# Patient Record
Sex: Female | Born: 2005 | Race: Black or African American | Hispanic: No | Marital: Single | Smoking: Never smoker
Health system: Southern US, Community
[De-identification: ages and names within clinical notes are randomized; demographics above are authoritative.]

## PROBLEM LIST (undated history)

## (undated) DIAGNOSIS — L910 Hypertrophic scar: Secondary | ICD-10-CM

## (undated) DIAGNOSIS — E301 Precocious puberty: Secondary | ICD-10-CM

---
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
## 2024-01-04 ENCOUNTER — Telehealth: Payer: Self-pay | Admitting: Radiation Oncology

## 2024-01-04 NOTE — Telephone Encounter (Signed)
 8/26 @ 10:20 am Patient's mother called to setup appt same day after sx for keloid removal.  Called Support RTT forward call as requested.

## 2024-02-17 ENCOUNTER — Encounter: Payer: Self-pay | Admitting: Dermatology

## 2024-02-23 ENCOUNTER — Ambulatory Visit
Admission: RE | Admit: 2024-02-23 | Discharge: 2024-02-23 | Disposition: A | Source: Ambulatory Visit | Attending: Radiation Oncology

## 2024-02-23 ENCOUNTER — Encounter: Payer: Self-pay | Admitting: Dermatology

## 2024-02-23 ENCOUNTER — Ambulatory Visit
Admission: RE | Admit: 2024-02-23 | Discharge: 2024-02-23 | Disposition: A | Discharge status: Home / Self Care | Source: Ambulatory Visit | Attending: Radiation Oncology | Admitting: Radiation Oncology

## 2024-02-23 ENCOUNTER — Ambulatory Visit (INDEPENDENT_AMBULATORY_CARE_PROVIDER_SITE_OTHER): Admitting: Dermatology

## 2024-02-23 ENCOUNTER — Other Ambulatory Visit: Payer: Self-pay

## 2024-02-23 VITALS — BP 142/84 | HR 83

## 2024-02-23 DIAGNOSIS — L910 Hypertrophic scar: Secondary | ICD-10-CM | POA: Diagnosis not present

## 2024-02-23 DIAGNOSIS — Z510 Encounter for antineoplastic radiation therapy: Secondary | ICD-10-CM | POA: Insufficient documentation

## 2024-02-23 DIAGNOSIS — D485 Neoplasm of uncertain behavior of skin: Secondary | ICD-10-CM

## 2024-02-23 LAB — RAD ONC ARIA SESSION SUMMARY
Course Elapsed Days: 0
Plan Fractions Treated to Date: 1
Plan Prescribed Dose Per Fraction: 5 Gy
Plan Total Fractions Prescribed: 3
Plan Total Prescribed Dose: 15 Gy
Reference Point Dosage Given to Date: 5 Gy
Reference Point Session Dosage Given: 5 Gy
Session Number: 1

## 2024-02-23 MED ORDER — TRIAMCINOLONE ACETONIDE 10 MG/ML IJ SUSP
10.0000 mg | Freq: Once | INTRAMUSCULAR | Status: AC
  Administered 2024-02-23: 10 mg

## 2024-02-23 NOTE — Patient Instructions (Signed)

## 2024-02-23 NOTE — Progress Notes (Addendum)
 Follow-Up Visit   Subjective  Shelby Tran is a 18 y.o. female who presents for the following: Excision of 2 keloids on the left ear. Patient is scheduled for treatment with radiation therapy, later today with Dr. Izell.   She is accompanied by her parents.   The following portions of the chart were reviewed this encounter and updated as appropriate: medications, allergies, medical history  Review of Systems:  No other skin or systemic complaints except as noted in HPI or Assessment and Plan.  Objective  Well appearing patient in no apparent distress; mood and affect are within normal limits.  A focused examination was performed of the following areas: Left ear Relevant physical exam findings are noted in the Assessment and Plan.   superior Left Ear Firm skin colored plaque  Left Ear Firm skin colored plaque  Assessment & Plan   NEOPLASM OF UNCERTAIN BEHAVIOR OF SKIN (2) superior Left Ear Skin excision  Excision method:  elliptical Lesion length (cm):  2.8 Lesion width (cm):  0 Margin per side (cm):  0.1 Total excision diameter (cm):  3 Informed consent: discussed and consent obtained   Timeout: patient name, date of birth, surgical site, and procedure verified   Procedure prep:  Patient was prepped and draped in usual sterile fashion Prep type:  Chlorhexidine Anesthesia: the lesion was anesthetized in a standard fashion   Anesthetic:  1% lidocaine  w/ epinephrine  1-100,000 buffered w/ 8.4% NaHCO3 Instrument used: #15 blade   Hemostasis achieved with: pressure and electrodesiccation   Outcome: patient tolerated procedure well with no complications   Post-procedure details: sterile dressing applied and wound care instructions given   Dressing type: bandage, petrolatum and pressure dressing    Specimen 1 - Surgical pathology Differential Diagnosis: R/O keloid vs other  Check Margins: No Left Ear Skin excision  Excision method:  elliptical Lesion length (cm):   2.2 Lesion width (cm):  0 Margin per side (cm):  0.1 Total excision diameter (cm):  2.4 Informed consent: discussed and consent obtained   Timeout: patient name, date of birth, surgical site, and procedure verified   Procedure prep:  Patient was prepped and draped in usual sterile fashion Prep type:  Chlorhexidine Anesthesia: the lesion was anesthetized in a standard fashion   Anesthetic:  1% lidocaine  w/ epinephrine  1-100,000 buffered w/ 8.4% NaHCO3 Instrument used: #15 blade   Hemostasis achieved with: pressure and electrodesiccation   Outcome: patient tolerated procedure well with no complications   Post-procedure details: sterile dressing applied and wound care instructions given   Dressing type: pressure dressing, bandage and petrolatum    Specimen 2 - Surgical pathology Differential Diagnosis: r/o Keloid vs other  Check Margins: No Procedure Note Intralesional Injection  Location: left ear  Informed Consent: Discussed risks (infection, pain, bleeding, bruising, thinning of the skin, loss of skin pigment, lack of resolution, and recurrence of lesion) and benefits of the procedure, as well as the alternatives. Informed consent was obtained. Preparation: The area was prepared a standard fashion.  Anesthesia:Lidocaine  1% with epinephrine   Procedure Details: An intralesional injection was performed with Kenalog  10 mg/cc. 0.9 cc in total were injected. NDC #: 9996-9505-79 Exp: March 2026 [id number]:[id number]  Total number of injections: 2  Plan: The patient was instructed on post-op care. Recommend OTC analgesia as needed for pain.  Related Medications triamcinolone  acetonide (KENALOG ) 10 MG/ML injection 10 mg    Return in about 4 weeks (around 03/22/2024) for Keloid f/u and injections .  I, Berwyn Lesches, Surg Tech  III, am acting as scribe for RUFUS CHRISTELLA HOLY, MD.   Documentation: I have reviewed the above documentation for accuracy and completeness, and I agree with the  above.  RUFUS CHRISTELLA HOLY, MD

## 2024-02-24 ENCOUNTER — Ambulatory Visit

## 2024-02-25 ENCOUNTER — Other Ambulatory Visit: Payer: Self-pay

## 2024-02-25 ENCOUNTER — Ambulatory Visit

## 2024-02-25 ENCOUNTER — Ambulatory Visit
Admission: RE | Admit: 2024-02-25 | Discharge: 2024-02-25 | Disposition: A | Source: Ambulatory Visit | Attending: Radiation Oncology

## 2024-02-25 ENCOUNTER — Ambulatory Visit: Payer: Self-pay | Admitting: Dermatology

## 2024-02-25 DIAGNOSIS — Z510 Encounter for antineoplastic radiation therapy: Secondary | ICD-10-CM | POA: Diagnosis not present

## 2024-02-25 LAB — RAD ONC ARIA SESSION SUMMARY
Course Elapsed Days: 2
Plan Fractions Treated to Date: 2
Plan Prescribed Dose Per Fraction: 5 Gy
Plan Total Fractions Prescribed: 3
Plan Total Prescribed Dose: 15 Gy
Reference Point Dosage Given to Date: 10 Gy
Reference Point Session Dosage Given: 5 Gy
Session Number: 2

## 2024-02-25 LAB — SURGICAL PATHOLOGY

## 2024-02-28 ENCOUNTER — Other Ambulatory Visit: Payer: Self-pay

## 2024-02-28 ENCOUNTER — Ambulatory Visit

## 2024-02-28 ENCOUNTER — Ambulatory Visit
Admission: RE | Admit: 2024-02-28 | Discharge: 2024-02-28 | Disposition: A | Discharge status: Home / Self Care | Source: Ambulatory Visit | Attending: Radiation Oncology | Admitting: Radiation Oncology

## 2024-02-28 DIAGNOSIS — Z510 Encounter for antineoplastic radiation therapy: Secondary | ICD-10-CM | POA: Diagnosis not present

## 2024-02-28 LAB — RAD ONC ARIA SESSION SUMMARY
Course Elapsed Days: 5
Plan Fractions Treated to Date: 3
Plan Prescribed Dose Per Fraction: 5 Gy
Plan Total Fractions Prescribed: 3
Plan Total Prescribed Dose: 15 Gy
Reference Point Dosage Given to Date: 15 Gy
Reference Point Session Dosage Given: 5 Gy
Session Number: 3

## 2024-03-01 NOTE — Radiation Completion Notes (Signed)
 Patient Name: BRALEE, FELDT MRN: [id number] Date of Birth: [birth year] Referring Physician: RUFUS HOLY, M.D. Date of Service: 2024-03-01 Radiation Oncologist: Lauraine Golden, M.D. [facility]                             RADIATION ONCOLOGY END OF TREATMENT NOTE     Diagnosis: L91.0 Hypertrophic scar Intent: Curative     ==========DELIVERED PLANS==========  First Treatment Date: 2024-02-23 Last Treatment Date: 2024-02-28   Plan Name: [id number] Site: Roni, Left Technique: Electron Mode: Electron Dose Per Fraction: 5 Gy Prescribed Dose (Delivered / Prescribed): 15 Gy / 15 Gy Prescribed Fxs (Delivered / Prescribed): 3 / 3     ==========ON TREATMENT VISIT DATES========== 2024-02-23, 2024-02-28     ==========UPCOMING VISITS========== 03/23/2024 [facility]-DERMATOLOGY FOLLOW UP 15 Paci, RUFUS HERO, MD        ==========APPENDIX - ON TREATMENT VISIT NOTES==========   See weekly On Treatment Notes in Epic for details in the Media tab (listed as Progress notes on the On Treatment Visit Dates listed above).

## 2024-03-23 ENCOUNTER — Encounter: Payer: Self-pay | Admitting: Dermatology

## 2024-03-23 ENCOUNTER — Ambulatory Visit: Admitting: Dermatology

## 2024-03-23 VITALS — BP 127/77 | HR 86

## 2024-03-23 DIAGNOSIS — L910 Hypertrophic scar: Secondary | ICD-10-CM

## 2024-03-23 DIAGNOSIS — T1490XD Injury, unspecified, subsequent encounter: Secondary | ICD-10-CM

## 2024-03-23 MED ORDER — TRIAMCINOLONE ACETONIDE 10 MG/ML IJ SUSP
10.0000 mg | Freq: Once | INTRAMUSCULAR | Status: AC
  Administered 2024-03-23: 10 mg

## 2024-03-23 NOTE — Patient Instructions (Signed)

## 2024-03-23 NOTE — Progress Notes (Signed)
   Follow Up Visit   Subjective  Shelby Tran is a 18 y.o. female who presents for the following: follow up from Mohs surgery   The patient presents for follow up from an excision for a keloid on the left ear and left ear superior, treated on 02/23/2024, healing by second intention. The patient has been bandaging the wound as directed. The endorse the following concerns: none. Patient is accompanied by her mom.   Patient reports that she had 3 radiation treatments, the last one on 02/28/2024. A kenalog  inject was performed on the day of the excision  The following portions of the chart were reviewed this encounter and updated as appropriate: medications, allergies, medical history  Review of Systems:  No other skin or systemic complaints except as noted in HPI or Assessment and Plan.  Objective  Well appearing patient in no apparent distress; mood and affect are within normal limits.  A focal examination was performed including the left ear. All findings within normal limits unless otherwise noted below.  Healing wound with mild erythema  Relevant physical exam findings are noted in the Assessment and Plan.  Left Mid Helix, Left Superior Helix Healing wound  Assessment & Plan   Healing Wound s/p excision for Keloid on the left mid and superior helix, treated on 02/23/2024, repaired with second intention.  - Reassured that wound is healing well - No evidence of infection - No swelling, induration, purulence, dehiscence, or tenderness out of proportion to the clinical exam, see photo above - Ok to continue ointment daily to wound under a bandage until scabbing resolves. - Recommend ILK 10 today  KELOID (2) Left Mid Helix, Left Superior Helix Intralesional injection - Left Mid Helix, Left Superior Helix Procedure Note Intralesional Injection  Location: left ear and left superior ear  Informed Consent: Discussed risks (infection, pain, bleeding, bruising, thinning of the skin, loss  of skin pigment, lack of resolution, and recurrence of lesion) and benefits of the procedure, as well as the alternatives. Informed consent was obtained. Preparation: The area was prepared a standard fashion.  Anesthesia:none.   Procedure Details: An intralesional injection was performed with Kenalog  10 mg/cc. 0.5 cc in total were injected. NDC #: 9996-9505-79 Exp: 08/07/2024  Total number of injections: 2  Plan: The patient was instructed on post-op care. Recommend OTC analgesia as needed for pain.   Related Medications triamcinolone  acetonide (KENALOG ) 10 MG/ML injection 10 mg  HEALING WOUND    Return in about 4 weeks (around 04/20/2024) for repeat kenalog  injection left ear.SABRA LILLETTE Rollene Charlann, RN, am acting as scribe for RUFUS CHRISTELLA HOLY, MD .   Documentation: I have reviewed the above documentation for accuracy and completeness, and I agree with the above.  RUFUS CHRISTELLA HOLY, MD

## 2024-04-20 ENCOUNTER — Encounter: Payer: Self-pay | Admitting: Dermatology

## 2024-04-20 ENCOUNTER — Ambulatory Visit: Admitting: Dermatology

## 2024-04-20 VITALS — BP 126/78 | HR 82

## 2024-04-20 DIAGNOSIS — S01302A Unspecified open wound of left ear, initial encounter: Secondary | ICD-10-CM

## 2024-04-20 DIAGNOSIS — L910 Hypertrophic scar: Secondary | ICD-10-CM | POA: Diagnosis not present

## 2024-04-20 DIAGNOSIS — T1490XD Injury, unspecified, subsequent encounter: Secondary | ICD-10-CM

## 2024-04-20 NOTE — Patient Instructions (Signed)

## 2024-04-20 NOTE — Progress Notes (Unsigned)
° °  Follow Up Visit   Subjective  Shelby Tran is a 18 y.o. female who presents for the following: follow up from keloid excision and radiation.   The patient presents for follow up from an excision for a keloid on the left ear and left ear superior, treated on 02/23/2024, healing by second intention. The patient has been bandaging the wound as directed. The endorse the following concerns: none. Patient is accompanied by her mom.   Patient reports that she had 3 radiation treatments, the last one on 02/28/2024. A kenalog  inject was performed on the day of the excision and on 03/23/2024.  The following portions of the chart were reviewed this encounter and updated as appropriate: medications, allergies, medical history  Review of Systems:  No other skin or systemic complaints except as noted in HPI or Assessment and Plan.  Objective  Well appearing patient in no apparent distress; mood and affect are within normal limits.  A focal examination was performed including the left ear. All findings within normal limits unless otherwise noted below.  Healing wound with mild erythema  Relevant physical exam findings are noted in the Assessment and Plan.  Left Mid Helix, Left Superior Helix Healing wound  Assessment & Plan   Healing Wound s/p excision for Keloid on the left mid and superior helix, treated on 02/23/2024, repaired with second intention.  - Reassured that wound is healing well - No evidence of infection - No swelling, induration, purulence, dehiscence, or tenderness out of proportion to the clinical exam, see photo above - Ok to continue ointment daily to wound under a bandage until scabbing resolves. - Recommend ILK 10 today KELOID (2) Left Mid Helix, Left Superior Helix - Intralesional injection - Left Mid Helix, Left Superior Helix Procedure Note Intralesional Injection  Location: left medial helix and left superior helix  Informed Consent: Discussed risks (infection,  pain, bleeding, bruising, thinning of the skin, loss of skin pigment, lack of resolution, and recurrence of lesion) and benefits of the procedure, as well as the alternatives. Informed consent was obtained. Preparation: The area was prepared a standard fashion.  Anesthesia:none.   Procedure Details: An intralesional injection was performed with Kenalog  10 mg/cc. 0.3 cc in total were injected. NDC #: 9996-9505-79 Exp: March 2026  Total number of injections: 2 injections.   Plan: The patient was instructed on post-op care. Recommend OTC analgesia as needed for pain.    Return in about 3 months (around 07/19/2024) for kenalog  injections. SABRA LILLETTE Rollene Charlann, RN, am acting as scribe for RUFUS CHRISTELLA HOLY, MD .   Documentation: I have reviewed the above documentation for accuracy and completeness, and I agree with the above.  RUFUS CHRISTELLA HOLY, MD

## 2024-07-24 ENCOUNTER — Ambulatory Visit: Admitting: Dermatology

## 2024-07-26 ENCOUNTER — Ambulatory Visit: Admitting: Dermatology
